# Patient Record
Sex: Male | Born: 1995 | Race: White | Hispanic: No | Marital: Single | Smoking: Never smoker
Health system: Southern US, Community
[De-identification: ages and names within clinical notes are randomized; demographics above are authoritative.]

---

## 2016-03-28 DIAGNOSIS — J039 Acute tonsillitis, unspecified: Secondary | ICD-10-CM | POA: Diagnosis not present

## 2016-07-12 DIAGNOSIS — K589 Irritable bowel syndrome without diarrhea: Secondary | ICD-10-CM | POA: Diagnosis not present

## 2016-07-12 DIAGNOSIS — R040 Epistaxis: Secondary | ICD-10-CM | POA: Diagnosis not present

## 2016-07-19 DIAGNOSIS — R040 Epistaxis: Secondary | ICD-10-CM | POA: Diagnosis not present

## 2016-07-26 DIAGNOSIS — R040 Epistaxis: Secondary | ICD-10-CM | POA: Diagnosis not present

## 2016-08-16 DIAGNOSIS — K589 Irritable bowel syndrome without diarrhea: Secondary | ICD-10-CM | POA: Diagnosis not present

## 2016-08-16 DIAGNOSIS — Z0000 Encounter for general adult medical examination without abnormal findings: Secondary | ICD-10-CM | POA: Diagnosis not present

## 2017-01-11 DIAGNOSIS — I7300 Raynaud's syndrome without gangrene: Secondary | ICD-10-CM | POA: Diagnosis not present

## 2017-01-11 DIAGNOSIS — M940 Chondrocostal junction syndrome [Tietze]: Secondary | ICD-10-CM | POA: Diagnosis not present

## 2017-04-10 DIAGNOSIS — J069 Acute upper respiratory infection, unspecified: Secondary | ICD-10-CM | POA: Diagnosis not present

## 2017-04-16 DIAGNOSIS — H04123 Dry eye syndrome of bilateral lacrimal glands: Secondary | ICD-10-CM | POA: Diagnosis not present

## 2017-09-30 DIAGNOSIS — Z23 Encounter for immunization: Secondary | ICD-10-CM | POA: Diagnosis not present

## 2017-09-30 DIAGNOSIS — Z0000 Encounter for general adult medical examination without abnormal findings: Secondary | ICD-10-CM | POA: Diagnosis not present

## 2017-12-21 ENCOUNTER — Emergency Department (HOSPITAL_BASED_OUTPATIENT_CLINIC_OR_DEPARTMENT_OTHER): Payer: 59

## 2017-12-21 ENCOUNTER — Emergency Department (HOSPITAL_BASED_OUTPATIENT_CLINIC_OR_DEPARTMENT_OTHER)
Admission: EM | Admit: 2017-12-21 | Discharge: 2017-12-21 | Disposition: A | Discharge status: Home / Self Care | Payer: 59 | Attending: Emergency Medicine | Admitting: Emergency Medicine

## 2017-12-21 ENCOUNTER — Other Ambulatory Visit: Payer: Self-pay

## 2017-12-21 ENCOUNTER — Encounter (HOSPITAL_BASED_OUTPATIENT_CLINIC_OR_DEPARTMENT_OTHER): Payer: Self-pay | Admitting: Emergency Medicine

## 2017-12-21 DIAGNOSIS — R55 Syncope and collapse: Secondary | ICD-10-CM | POA: Insufficient documentation

## 2017-12-21 DIAGNOSIS — R51 Headache: Secondary | ICD-10-CM | POA: Diagnosis not present

## 2017-12-21 DIAGNOSIS — R42 Dizziness and giddiness: Secondary | ICD-10-CM | POA: Diagnosis not present

## 2017-12-21 LAB — URINALYSIS, ROUTINE W REFLEX MICROSCOPIC
BILIRUBIN URINE: NEGATIVE
Glucose, UA: NEGATIVE mg/dL
Hgb urine dipstick: NEGATIVE
KETONES UR: NEGATIVE mg/dL
Leukocytes, UA: NEGATIVE
NITRITE: NEGATIVE
Protein, ur: NEGATIVE mg/dL
pH: 7.5 (ref 5.0–8.0)

## 2017-12-21 LAB — COMPREHENSIVE METABOLIC PANEL WITH GFR
ALT: 46 U/L — ABNORMAL HIGH (ref 0–44)
AST: 27 U/L (ref 15–41)
Albumin: 5 g/dL (ref 3.5–5.0)
Alkaline Phosphatase: 50 U/L (ref 38–126)
Anion gap: 9 (ref 5–15)
BUN: 13 mg/dL (ref 6–20)
CO2: 28 mmol/L (ref 22–32)
Calcium: 9.9 mg/dL (ref 8.9–10.3)
Chloride: 101 mmol/L (ref 98–111)
Creatinine, Ser: 0.98 mg/dL (ref 0.61–1.24)
GFR calc Af Amer: >60 mL/min
GFR calc non Af Amer: >60 mL/min
Glucose, Bld: 92 mg/dL (ref 70–99)
Potassium: 3.9 mmol/L (ref 3.5–5.1)
Sodium: 138 mmol/L (ref 135–145)
Total Bilirubin: 1 mg/dL (ref 0.3–1.2)
Total Protein: 7.9 g/dL (ref 6.5–8.1)

## 2017-12-21 LAB — CBC WITH DIFFERENTIAL/PLATELET
Abs Immature Granulocytes: 0.01 10*3/uL (ref 0.00–0.07)
Basophils Absolute: 0.1 10*3/uL (ref 0.0–0.1)
Basophils Relative: 1 %
EOS ABS: 0.2 10*3/uL (ref 0.0–0.5)
EOS PCT: 3 %
HCT: 45 % (ref 39.0–52.0)
HEMOGLOBIN: 14.7 g/dL (ref 13.0–17.0)
Immature Granulocytes: 0 %
LYMPHS PCT: 32 %
Lymphs Abs: 2.4 10*3/uL (ref 0.7–4.0)
MCH: 29.6 pg (ref 26.0–34.0)
MCHC: 32.7 g/dL (ref 30.0–36.0)
MCV: 90.5 fL (ref 80.0–100.0)
MONO ABS: 0.9 10*3/uL (ref 0.1–1.0)
Monocytes Relative: 12 %
NRBC: 0 % (ref 0.0–0.2)
Neutro Abs: 3.8 10*3/uL (ref 1.7–7.7)
Neutrophils Relative %: 52 %
Platelets: 355 10*3/uL (ref 150–400)
RBC: 4.97 MIL/uL (ref 4.22–5.81)
RDW: 12 % (ref 11.5–15.5)
WBC: 7.3 10*3/uL (ref 4.0–10.5)

## 2017-12-21 LAB — TROPONIN I: Troponin I: <0.03 ng/mL

## 2017-12-21 LAB — CBG MONITORING, ED: Glucose-Capillary: 73 mg/dL (ref 70–99)

## 2017-12-21 MED ORDER — SODIUM CHLORIDE 0.9 % IV SOLN: INTRAVENOUS | Status: DC

## 2017-12-21 MED ORDER — SODIUM CHLORIDE 0.9 % IV BOLUS
1000.0000 mL | Freq: Once | INTRAVENOUS | Status: AC
  Administered 2017-12-21: 1000 mL via INTRAVENOUS

## 2017-12-21 NOTE — ED Triage Notes (Signed)
Pt reports that at 9pm last night he suddenly became dizzy and nauseated after he urinated and woke up on the floor. Denies injury from the fall. He reports feeling "fuzzy headed at this time". Sent from [facility] for eval.

## 2017-12-21 NOTE — ED Provider Notes (Signed)
[facility] EMERGENCY DEPARTMENT Provider Note   CSN: [id number] Arrival date & time: 12/21/17  1117     History   Chief Complaint Chief Complaint  Patient presents with  . Loss of Consciousness    HPI Rustin Erhart is a 22 y.o. male.  Pt said that he was standing and urinating in the restroom around 2100 last night.  He felt dizzy and nauseous and fell down.  He woke up on the ground.  He said he was shaking, but knew he was shaking.  The pt said he's been feeling fuzzy since then.  He initially went to UC, then was sent here.  He has never had this happen to him in the past.  He denies f/c.  No bowel or bladder incontinence.  He did not bite his tongue.     History reviewed. No pertinent past medical history.  There are no active problems to display for this patient.   History reviewed. No pertinent surgical history.      Home Medications    Prior to Admission medications   Not on File    Family History No family history on file.  Social History Social History   Tobacco Use  . Smoking status: Never Smoker  . Smokeless tobacco: Never Used  Substance Use Topics  . Alcohol use: Yes  . Drug use: Never     Allergies   Patient has no known allergies.   Review of Systems Review of Systems  Neurological: Positive for syncope and headaches.  All other systems reviewed and are negative.    Physical Exam Updated Vital Signs BP 136/84 (BP Location: Right Arm)   Pulse 83   Temp 98.6 F (37 C) (Oral)   Resp 18   Ht 5\' 11"  (1.803 m)   Wt 81.6 kg   SpO2 100%   BMI 25.10 kg/m   Physical Exam  Constitutional: He is oriented to person, place, and time. He appears well-developed and well-nourished.  HENT:  Head: Normocephalic and atraumatic.  Right Ear: External ear normal.  Left Ear: External ear normal.  Nose: Nose normal.  Mouth/Throat: Oropharynx is clear and moist.  Eyes: Pupils are equal, round, and reactive to light. Conjunctivae  and EOM are normal.  Neck: Normal range of motion. Neck supple.  Cardiovascular: Normal rate, regular rhythm, normal heart sounds and intact distal pulses.  Pulmonary/Chest: Effort normal and breath sounds normal.  Abdominal: Soft. Bowel sounds are normal.  Musculoskeletal: Normal range of motion.  Neurological: He is alert and oriented to person, place, and time.  Skin: Skin is warm. Capillary refill takes less than 2 seconds.  Psychiatric: He has a normal mood and affect. His behavior is normal. Judgment and thought content normal.  Nursing note and vitals reviewed.    ED Treatments / Results  Labs (all labs ordered are listed, but only abnormal results are displayed) Labs Reviewed  COMPREHENSIVE METABOLIC PANEL - Abnormal; Notable for the following components:      Result Value   ALT 46 (*)    All other components within normal limits  URINALYSIS, ROUTINE W REFLEX MICROSCOPIC - Abnormal; Notable for the following components:   Color, Urine STRAW (*)    Specific Gravity, Urine <1.005 (*)    All other components within normal limits  CBC WITH DIFFERENTIAL/PLATELET  TROPONIN I  CBG MONITORING, ED    EKG EKG Interpretation  Date/Time:  Saturday December 21 2017 11:50:52 EDT Ventricular Rate:  77 PR Interval:  QRS Duration: 113 QT Interval:  363 QTC Calculation: 411 R Axis:   101 Text Interpretation:  Sinus rhythm Incomplete right bundle branch block No old tracing to compare Confirmed by Jacalyn Lefevre [phone]) on 12/21/2017 12:04:19 PM   Radiology Dg Chest 2 View  Result Date: 12/21/2017 CLINICAL DATA:  Possible syncope, dizziness EXAM: CHEST - 2 VIEW COMPARISON:  None. FINDINGS: Lungs are clear.  No pleural effusion or pneumothorax. The heart is normal in size. Visualized osseous structures are within normal limits. IMPRESSION: Normal chest radiographs. Electronically Signed   By: Charline Bills M.D.   On: 12/21/2017 13:04   Ct Head Wo Contrast  Result Date:  12/21/2017 CLINICAL DATA:  Dizziness, headache, possible syncope EXAM: CT HEAD WITHOUT CONTRAST TECHNIQUE: Contiguous axial images were obtained from the base of the skull through the vertex without intravenous contrast. COMPARISON:  None. FINDINGS: Brain: No evidence of acute infarction, hemorrhage, hydrocephalus, extra-axial collection or mass lesion/mass effect. Vascular: No hyperdense vessel or unexpected calcification. Skull: Normal. Negative for fracture or focal lesion. Sinuses/Orbits: The visualized paranasal sinuses are essentially clear. The mastoid air cells are unopacified. Other: None. IMPRESSION: Normal head CT. Electronically Signed   By: Charline Bills M.D.   On: 12/21/2017 13:05    Procedures Procedures (including critical care time)  Medications Ordered in ED Medications  sodium chloride 0.9 % bolus 1,000 mL (1,000 mLs Intravenous New Bag/Given 12/21/17 1214)    And  0.9 %  sodium chloride infusion (has no administration in time range)     Initial Impression / Assessment and Plan / ED Course  I have reviewed the triage vital signs and the nursing notes.  Pertinent labs & imaging results that were available during my care of the patient were reviewed by me and considered in my medical decision making (see chart for details).    Etiology of syncope is unclear.  I suspect it was orthostatic as it happened after leaning over to flush the toilet.  I don't think he had a seizure as he had no post ictal sx, nor did he have any bowel or bladder incontinence.  He was aware of the shaking.   Pt encouraged to drink lots of water, eat small, frequent meals and to f/u with pcp.  Return if worse.  Final Clinical Impressions(s) / ED Diagnoses   Final diagnoses:  Syncope, unspecified syncope type    ED Discharge Orders    None       Jacalyn Lefevre, MD 12/21/17 1334

## 2019-12-13 IMAGING — CT CT HEAD W/O CM
3 series · 16 of 47 positions shown, 19 images · non-contrast
Comparison: None.

CLINICAL DATA: Dizziness, headache, possible syncope

EXAM:
CT HEAD WITHOUT CONTRAST
TECHNIQUE: Contiguous axial images were obtained from the base of the skull
through the vertex without intravenous contrast.

[Series 2: head wo · axial · 0.49mm/px · z∈[-68,+82]mm · 10 of 36 slices shown, 13 images]
[im 3/36  brain]
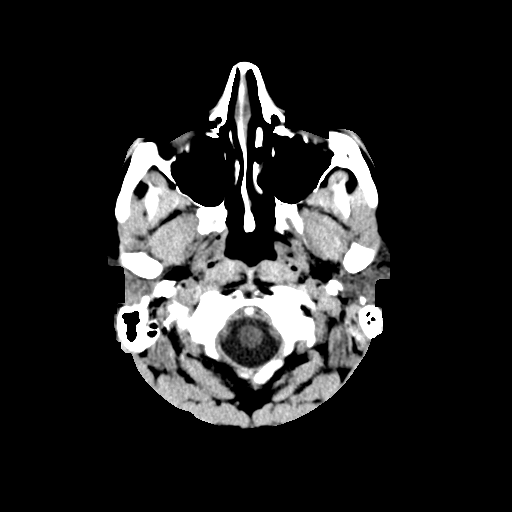
[im 3/36  bone]
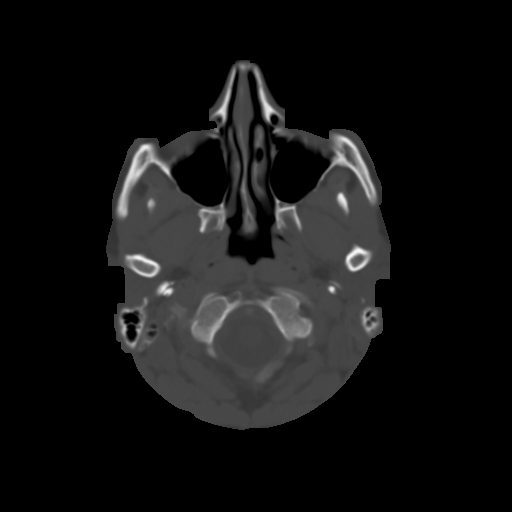
[im 7/36  brain]
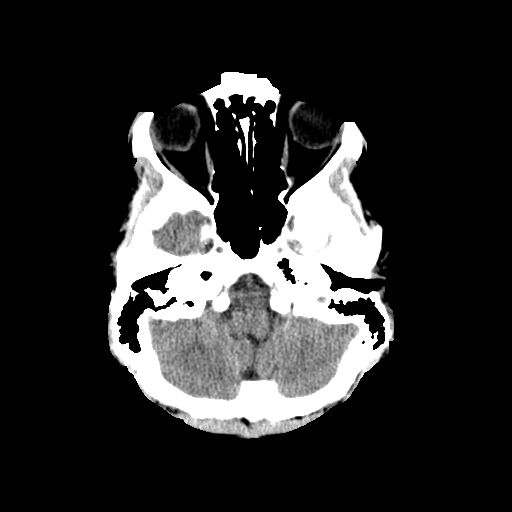
[im 10/36  brain]
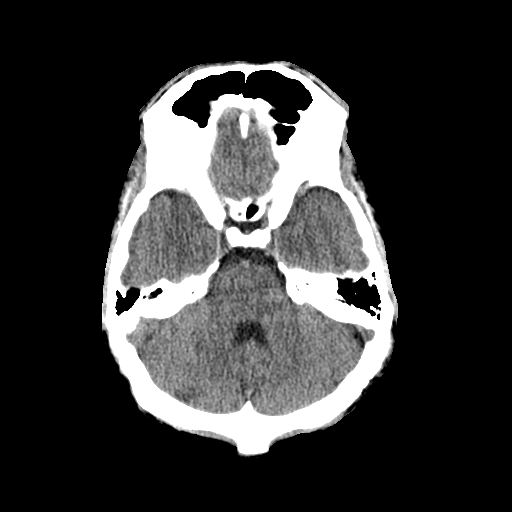
[im 13/36  brain]
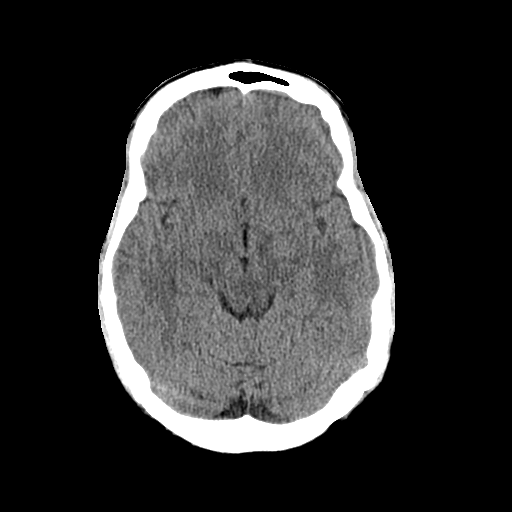
[im 16/36  brain]
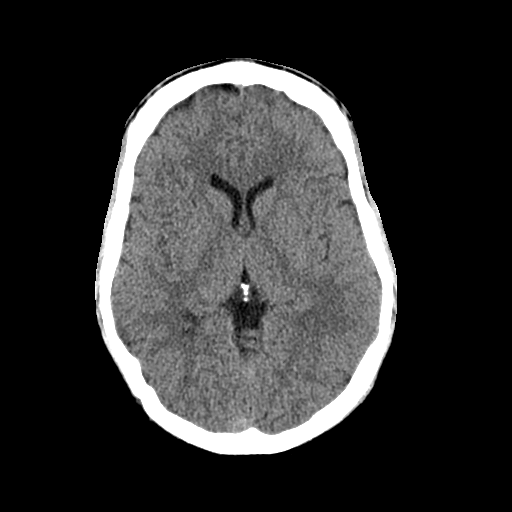
[im 16/36  bone]
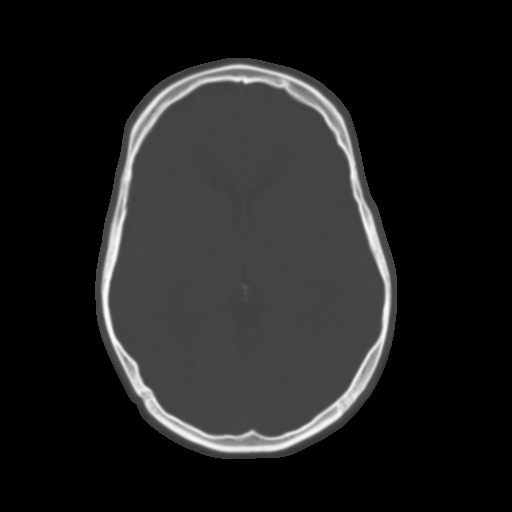
[im 20/36  brain]
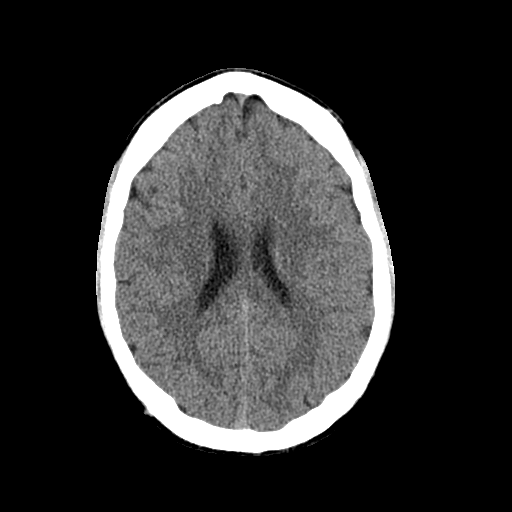
[im 23/36  brain]
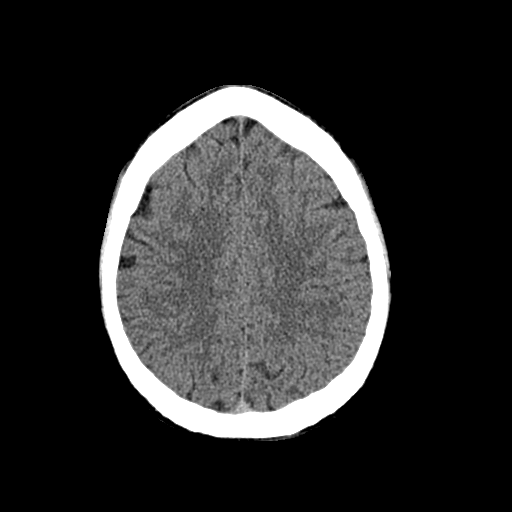
[im 27/36  brain]
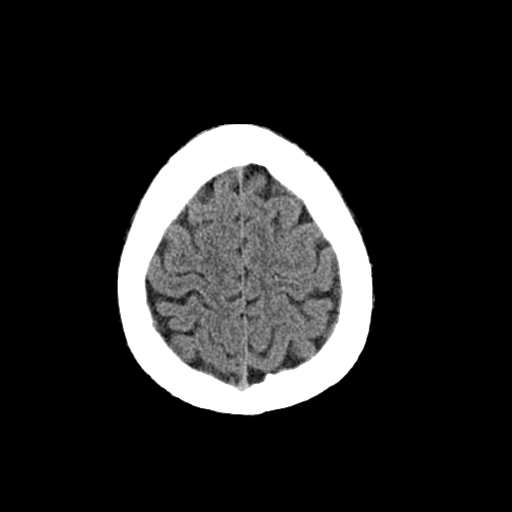
[im 29/36  brain]
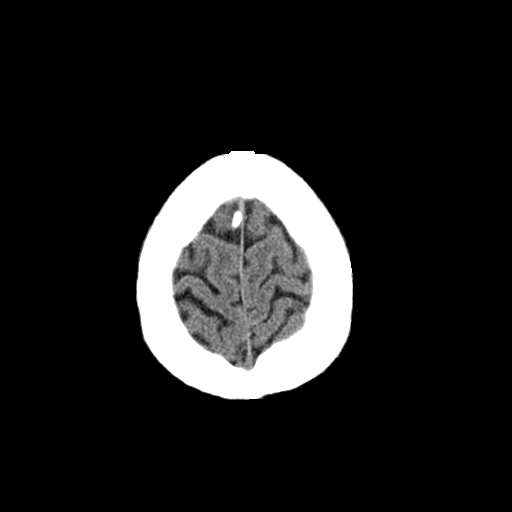
[im 29/36  bone]
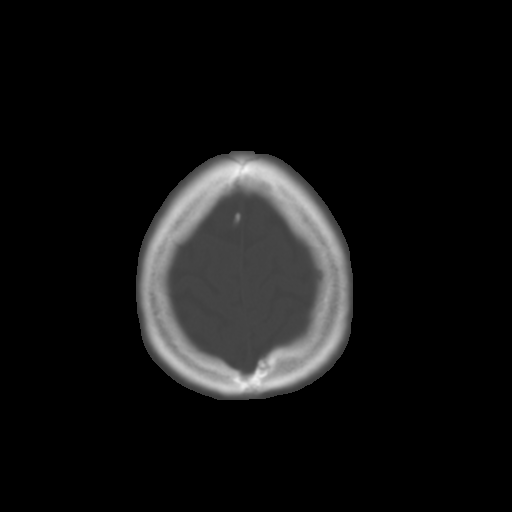
[im 33/36  brain]
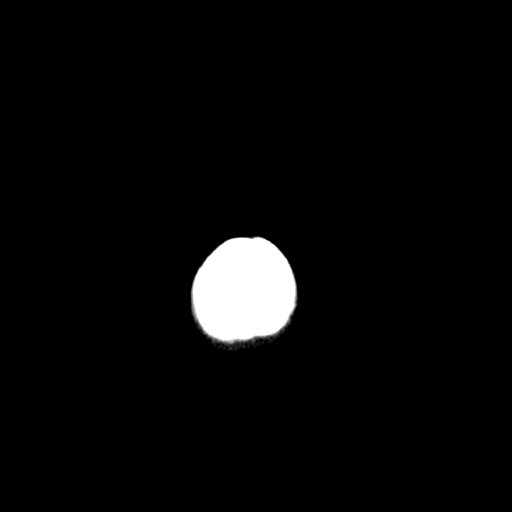

[Series 4: cor soft · coronal · 0.36mm/px · 3 of 72 slices shown]
[im 24/72  brain]
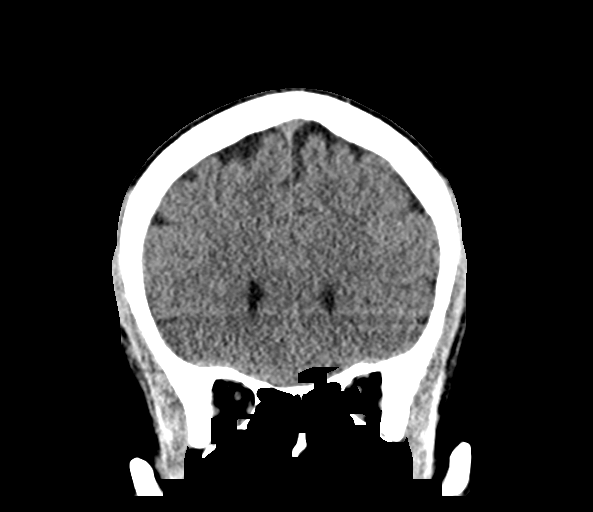
[im 32/72  brain]
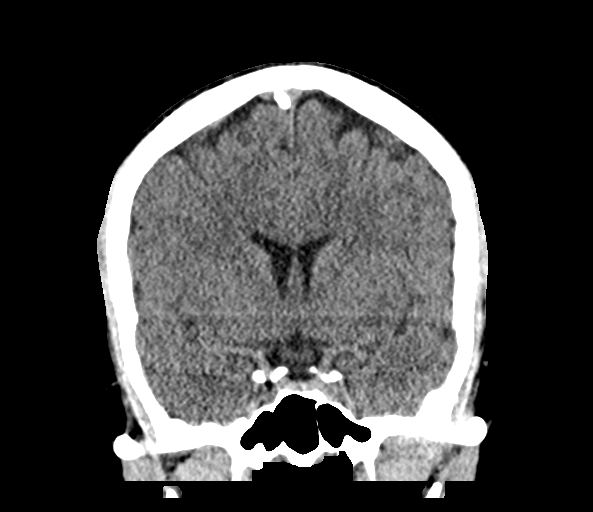
[im 40/72  brain]
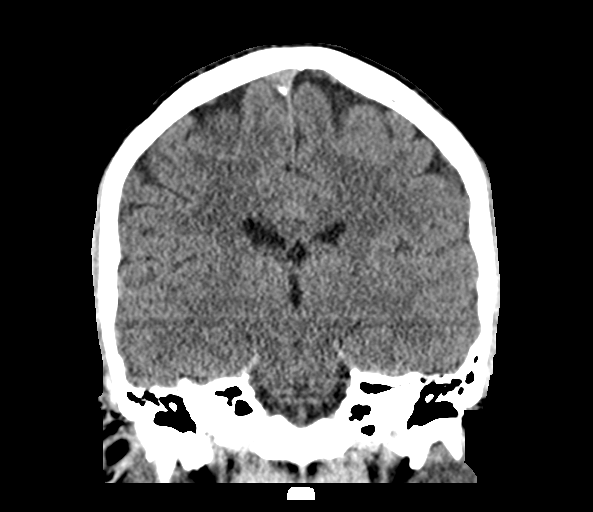

[Series 5: sag soft · sagittal · 0.36mm/px · 3 of 72 slices shown]
[im 24/72  brain]
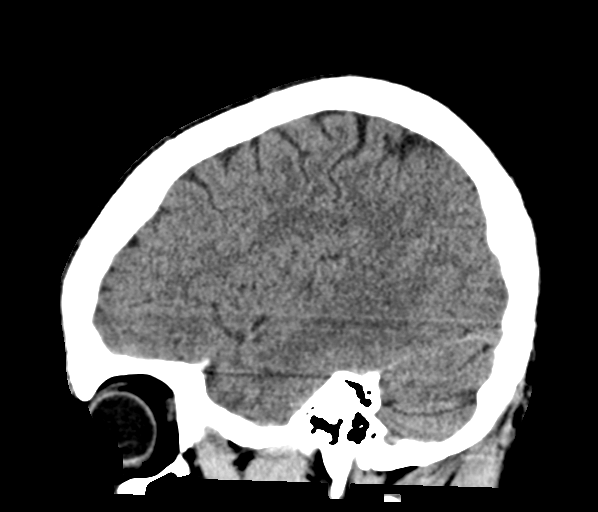
[im 36/72  brain]
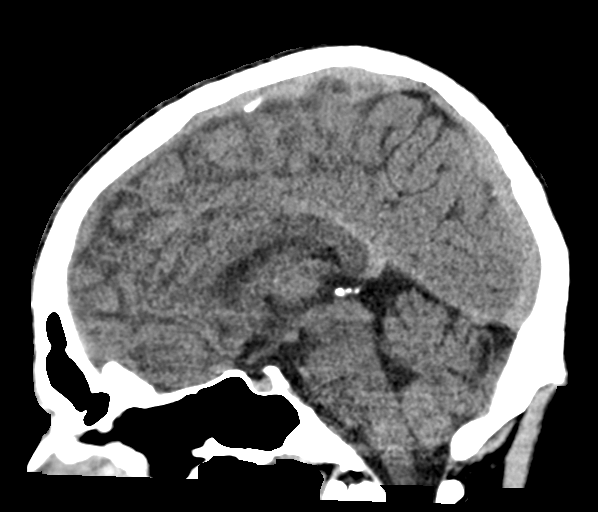
[im 48/72  brain]
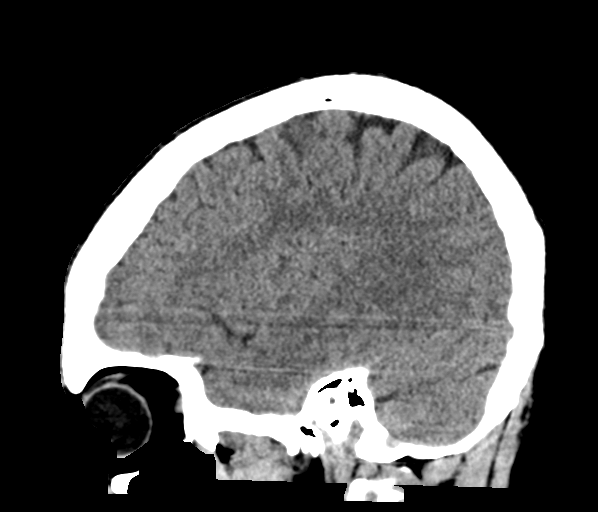

[16 of 47 positions shown; findings below may reference images not displayed]

FINDINGS: Brain: No evidence of acute infarction, hemorrhage, hydrocephalus,
extra-axial collection or mass lesion/mass effect.

Vascular: No hyperdense vessel or unexpected calcification.

Skull: Normal. Negative for fracture or focal lesion.

Sinuses/Orbits: The visualized paranasal sinuses are essentially
clear. The mastoid air cells are unopacified.

Other: None.
IMPRESSION: Normal head CT.

## 2022-04-24 DIAGNOSIS — Z0000 Encounter for general adult medical examination without abnormal findings: Secondary | ICD-10-CM | POA: Diagnosis not present

## 2022-04-25 DIAGNOSIS — H04123 Dry eye syndrome of bilateral lacrimal glands: Secondary | ICD-10-CM | POA: Diagnosis not present

## 2023-01-14 DIAGNOSIS — J300 Vasomotor rhinitis: Secondary | ICD-10-CM | POA: Diagnosis not present

## 2023-02-06 ENCOUNTER — Encounter (INDEPENDENT_AMBULATORY_CARE_PROVIDER_SITE_OTHER): Payer: Self-pay | Admitting: Otolaryngology

## 2023-02-26 DIAGNOSIS — Z113 Encounter for screening for infections with a predominantly sexual mode of transmission: Secondary | ICD-10-CM | POA: Diagnosis not present

## 2023-02-26 DIAGNOSIS — A64 Unspecified sexually transmitted disease: Secondary | ICD-10-CM | POA: Diagnosis not present

## 2023-03-07 ENCOUNTER — Ambulatory Visit
Admission: EM | Admit: 2023-03-07 | Discharge: 2023-03-07 | Disposition: A | Discharge status: Home / Self Care | Payer: BC Managed Care – PPO | Attending: Internal Medicine | Admitting: Internal Medicine

## 2023-03-07 DIAGNOSIS — J069 Acute upper respiratory infection, unspecified: Secondary | ICD-10-CM

## 2023-03-07 MED ORDER — GUAIFENESIN ER 1200 MG PO TB12
1200.0000 mg | ORAL_TABLET | Freq: Two times a day (BID) | ORAL | 0 refills | Status: AC
Start: 2023-03-07 — End: ?

## 2023-03-07 MED ORDER — PROMETHAZINE-DM 6.25-15 MG/5ML PO SYRP: 5.0000 mL | ORAL_SOLUTION | Freq: Every evening | ORAL | 0 refills | Status: AC | PRN

## 2023-03-07 NOTE — ED Triage Notes (Addendum)
 Pt presents with complaints of dry, productive cough x 3 days. Pt states he is having left-sided chest pain only when coughing, describes his pain as sharp. Pt currently rates overall pain a 5/10. Robitussin and Advil are the only medications taken with little to no improvement. Pt states he was around his friend who was recently diagnosed with pneumonia.

## 2023-03-07 NOTE — ED Provider Notes (Signed)
 [facility]    CSN: [id number] Arrival date & time: 03/07/23  1009      History   Chief Complaint Chief Complaint  Patient presents with   Cough    HPI MERTON WADLOW is a 28 y.o. male.   Patient presents to urgent care for evaluation of cough, nasal congestion, sore throat, and generalized fatigue that started 3 days ago.  Reports recent sick contact with similar symptoms.  Cough is dry and nonproductive.  Reports bilateral upper chest pain associated with cough, otherwise no shortness of breath, dizziness, nausea, vomiting, abdominal pain, fever, chills, rash, or recent antibiotic or steroid use.  Never smoker, denies history of chronic respiratory problems.  Taking over-the-counter Robitussin cough and sinus medication with some relief.   Cough   History reviewed. No pertinent past medical history.  There are no active problems to display for this patient.   History reviewed. No pertinent surgical history.     Home Medications    Prior to Admission medications   Medication Sig Start Date End Date Taking? Authorizing Provider  Guaifenesin  1200 MG TB12 Take 1 tablet (1,200 mg total) by mouth in the morning and at bedtime. 03/07/23  Yes Enedelia Dorna HERO, FNP  promethazine -dextromethorphan (PROMETHAZINE -DM) 6.25-15 MG/5ML syrup Take 5 mLs by mouth at bedtime as needed for cough. 03/07/23  Yes Enedelia Dorna HERO, FNP    Family History History reviewed. No pertinent family history.  Social History Social History   Tobacco Use   Smoking status: Never   Smokeless tobacco: Never  Vaping Use   Vaping status: Never Used  Substance Use Topics   Alcohol use: Yes   Drug use: Never     Allergies   Patient has no known allergies.   Review of Systems Review of Systems  Respiratory:  Positive for cough.   Per HPI   Physical Exam Triage Vital Signs ED Triage Vitals  Encounter Vitals Group     BP 03/07/23 1109 120/85     Systolic BP Percentile  --      Diastolic BP Percentile --      Pulse Rate 03/07/23 1109 83     Resp 03/07/23 1109 18     Temp 03/07/23 1109 98.4 F (36.9 C)     Temp Source 03/07/23 1109 Oral     SpO2 03/07/23 1109 98 %     Weight 03/07/23 1108 190 lb (86.2 kg)     Height 03/07/23 1108 5' 11 (1.803 m)     Head Circumference --      Peak Flow --      Pain Score 03/07/23 1108 5     Pain Loc --      Pain Education --      Exclude from Growth Chart --    No data found.  Updated Vital Signs BP 120/85 (BP Location: Right Arm)   Pulse 83   Temp 98.4 F (36.9 C) (Oral)   Resp 18   Ht 5' 11 (1.803 m)   Wt 190 lb (86.2 kg)   SpO2 98%   BMI 26.50 kg/m   Visual Acuity Right Eye Distance:   Left Eye Distance:   Bilateral Distance:    Right Eye Near:   Left Eye Near:    Bilateral Near:     Physical Exam Vitals and nursing note reviewed.  Constitutional:      Appearance: He is not ill-appearing or toxic-appearing.  HENT:     Head: Normocephalic and atraumatic.  Right Ear: Hearing, tympanic membrane, ear canal and external ear normal.     Left Ear: Hearing, tympanic membrane, ear canal and external ear normal.     Nose: Congestion present.     Mouth/Throat:     Lips: Pink.     Mouth: Mucous membranes are moist. No injury or oral lesions.     Dentition: Normal dentition.     Tongue: No lesions.     Pharynx: Oropharynx is clear. Uvula midline. No pharyngeal swelling, oropharyngeal exudate, posterior oropharyngeal erythema, uvula swelling or postnasal drip.     Tonsils: No tonsillar exudate.  Eyes:     General: Lids are normal. Vision grossly intact. Gaze aligned appropriately.     Extraocular Movements: Extraocular movements intact.     Conjunctiva/sclera: Conjunctivae normal.  Neck:     Trachea: Trachea and phonation normal.  Cardiovascular:     Rate and Rhythm: Normal rate and regular rhythm.     Heart sounds: Normal heart sounds, S1 normal and S2 normal.  Pulmonary:     Effort:  Pulmonary effort is normal. No respiratory distress.     Breath sounds: Normal breath sounds and air entry. No wheezing, rhonchi or rales.  Chest:     Chest wall: No tenderness.  Musculoskeletal:     Cervical back: Neck supple.  Lymphadenopathy:     Cervical: No cervical adenopathy.  Skin:    General: Skin is warm and dry.     Capillary Refill: Capillary refill takes less than 2 seconds.     Findings: No rash.  Neurological:     General: No focal deficit present.     Mental Status: He is alert and oriented to person, place, and time. Mental status is at baseline.     Cranial Nerves: No dysarthria or facial asymmetry.  Psychiatric:        Mood and Affect: Mood normal.        Speech: Speech normal.        Behavior: Behavior normal.        Thought Content: Thought content normal.        Judgment: Judgment normal.      UC Treatments / Results  Labs (all labs ordered are listed, but only abnormal results are displayed) Labs Reviewed - No data to display  EKG   Radiology No results found.  Procedures Procedures (including critical care time)  Medications Ordered in UC Medications - No data to display  Initial Impression / Assessment and Plan / UC Course  I have reviewed the triage vital signs and the nursing notes.  Pertinent labs & imaging results that were available during my care of the patient were reviewed by me and considered in my medical decision making (see chart for details).   1.  Viral URI with cough Suspect viral URI, viral syndrome.  Strep/viral testing:   Physical exam findings reassuring, vital signs hemodynamically stable, and lungs clear, therefore deferred imaging of the chest.  Advised supportive care/prescriptions for symptomatic relief as outlined in AVS.   Counseled patient on potential for adverse effects with medications prescribed/recommended today, strict ER and return-to-clinic precautions discussed, patient verbalized understanding.     Final Clinical Impressions(s) / UC Diagnoses   Final diagnoses:  Viral URI with cough     Discharge Instructions      You have a viral illness which will improve on its own with rest, fluids, and medications to help with your symptoms.  Tylenol, guaifenesin  (plain mucinex ), and saline nasal  sprays may help relieve symptoms.   Two teaspoons of honey in 1 cup of warm water every 4-6 hours may help with throat pains. Promethazine  DM at bedtime as needed for cough.  Humidifier in room at nighttime may help soothe cough (clean well daily).   For chest pain, shortness of breath, inability to keep food or fluids down without vomiting, fever that does not respond to tylenol or motrin, or any other severe symptoms, please go to the ER for further evaluation. Return to urgent care as needed, otherwise follow-up with PCP.     ED Prescriptions     Medication Sig Dispense Auth. Provider   promethazine -dextromethorphan (PROMETHAZINE -DM) 6.25-15 MG/5ML syrup Take 5 mLs by mouth at bedtime as needed for cough. 118 mL Enedelia Going M, FNP   Guaifenesin  1200 MG TB12 Take 1 tablet (1,200 mg total) by mouth in the morning and at bedtime. 14 tablet Enedelia Going HERO, FNP      PDMP not reviewed this encounter.   Enedelia Going HERO, [place] 03/07/23 1127

## 2023-03-07 NOTE — Discharge Instructions (Signed)
 You have a viral illness which will improve on its own with rest, fluids, and medications to help with your symptoms.  Tylenol, guaifenesin (plain mucinex), and saline nasal sprays may help relieve symptoms.   Two teaspoons of honey in 1 cup of warm water every 4-6 hours may help with throat pains. Promethazine DM at bedtime as needed for cough.   Humidifier in room at nighttime may help soothe cough (clean well daily).   For chest pain, shortness of breath, inability to keep food or fluids down without vomiting, fever that does not respond to tylenol or motrin, or any other severe symptoms, please go to the ER for further evaluation. Return to urgent care as needed, otherwise follow-up with PCP.

## 2023-05-14 DIAGNOSIS — H5213 Myopia, bilateral: Secondary | ICD-10-CM | POA: Diagnosis not present

## 2023-05-14 DIAGNOSIS — H52222 Regular astigmatism, left eye: Secondary | ICD-10-CM | POA: Diagnosis not present

## 2023-05-29 DIAGNOSIS — Z0000 Encounter for general adult medical examination without abnormal findings: Secondary | ICD-10-CM | POA: Diagnosis not present

## 2023-05-29 DIAGNOSIS — N4889 Other specified disorders of penis: Secondary | ICD-10-CM | POA: Diagnosis not present

## 2023-05-29 DIAGNOSIS — I7300 Raynaud's syndrome without gangrene: Secondary | ICD-10-CM | POA: Diagnosis not present

## 2023-05-29 DIAGNOSIS — K589 Irritable bowel syndrome without diarrhea: Secondary | ICD-10-CM | POA: Diagnosis not present

## 2023-05-29 DIAGNOSIS — R361 Hematospermia: Secondary | ICD-10-CM | POA: Diagnosis not present

## 2023-06-20 DIAGNOSIS — R102 Pelvic and perineal pain: Secondary | ICD-10-CM | POA: Diagnosis not present

## 2023-06-20 DIAGNOSIS — R361 Hematospermia: Secondary | ICD-10-CM | POA: Diagnosis not present

## 2023-10-25 DIAGNOSIS — Z202 Contact with and (suspected) exposure to infections with a predominantly sexual mode of transmission: Secondary | ICD-10-CM | POA: Diagnosis not present
